# Patient Record
Sex: Male | Born: 1970 | Race: White | Hispanic: No | Marital: Single | Smoking: Never smoker
Health system: Southern US, Community
[De-identification: ages and names within clinical notes are randomized; demographics above are authoritative.]

## PROBLEM LIST (undated history)

## (undated) DIAGNOSIS — N19 Unspecified kidney failure: Secondary | ICD-10-CM

## (undated) DIAGNOSIS — Z8739 Personal history of other diseases of the musculoskeletal system and connective tissue: Secondary | ICD-10-CM

## (undated) DIAGNOSIS — Q676 Pectus excavatum: Secondary | ICD-10-CM

## (undated) DIAGNOSIS — Z87448 Personal history of other diseases of urinary system: Secondary | ICD-10-CM

## (undated) DIAGNOSIS — I341 Nonrheumatic mitral (valve) prolapse: Secondary | ICD-10-CM

## (undated) DIAGNOSIS — I10 Essential (primary) hypertension: Secondary | ICD-10-CM

## (undated) HISTORY — DX: Essential (primary) hypertension: I10

## (undated) HISTORY — DX: Nonrheumatic mitral (valve) prolapse: I34.1

## (undated) HISTORY — DX: Personal history of other diseases of urinary system: Z87.448

## (undated) HISTORY — DX: Personal history of other diseases of the musculoskeletal system and connective tissue: Z87.39

## (undated) HISTORY — PX: KIDNEY TRANSPLANT: SHX239

## (undated) HISTORY — PX: PECTUS EXCAVATUM REPAIR: SHX437

## (undated) HISTORY — DX: Unspecified kidney failure: N19

## (undated) HISTORY — DX: Pectus excavatum: Q67.6

---

## 2002-10-20 ENCOUNTER — Inpatient Hospital Stay (HOSPITAL_COMMUNITY): Admission: EM | Admit: 2002-10-20 | Discharge: 2002-10-27 | Payer: Self-pay | Admitting: Emergency Medicine

## 2002-10-20 ENCOUNTER — Encounter: Payer: Self-pay | Admitting: Emergency Medicine

## 2002-10-20 ENCOUNTER — Encounter (INDEPENDENT_AMBULATORY_CARE_PROVIDER_SITE_OTHER): Payer: Self-pay | Admitting: *Deleted

## 2002-10-23 ENCOUNTER — Encounter: Payer: Self-pay | Admitting: Nephrology

## 2002-10-30 ENCOUNTER — Encounter (HOSPITAL_COMMUNITY): Admission: RE | Admit: 2002-10-30 | Discharge: 2003-01-28 | Payer: Self-pay | Admitting: Nephrology

## 2002-12-25 HISTORY — PX: CARDIOVASCULAR STRESS TEST: SHX262

## 2003-01-03 ENCOUNTER — Ambulatory Visit (HOSPITAL_COMMUNITY): Admission: RE | Admit: 2003-01-03 | Discharge: 2003-01-04 | Payer: Self-pay | Admitting: Cardiology

## 2003-01-08 ENCOUNTER — Encounter: Admission: RE | Admit: 2003-01-08 | Discharge: 2003-01-08 | Payer: Self-pay | Admitting: Nephrology

## 2003-01-08 ENCOUNTER — Encounter: Payer: Self-pay | Admitting: Nephrology

## 2003-01-22 ENCOUNTER — Ambulatory Visit (HOSPITAL_COMMUNITY): Admission: RE | Admit: 2003-01-22 | Discharge: 2003-01-23 | Payer: Self-pay | Admitting: General Surgery

## 2003-04-03 ENCOUNTER — Ambulatory Visit (HOSPITAL_COMMUNITY): Admission: RE | Admit: 2003-04-03 | Discharge: 2003-04-03 | Payer: Self-pay | Admitting: Nephrology

## 2003-04-04 ENCOUNTER — Encounter: Admission: RE | Admit: 2003-04-04 | Discharge: 2003-04-04 | Payer: Self-pay | Admitting: Nephrology

## 2003-04-04 ENCOUNTER — Encounter: Payer: Self-pay | Admitting: Nephrology

## 2003-05-09 HISTORY — PX: US ECHOCARDIOGRAPHY: HXRAD669

## 2003-05-19 ENCOUNTER — Emergency Department (HOSPITAL_COMMUNITY): Admission: EM | Admit: 2003-05-19 | Discharge: 2003-05-19 | Payer: Self-pay | Admitting: Emergency Medicine

## 2003-06-19 ENCOUNTER — Emergency Department (HOSPITAL_COMMUNITY): Admission: EM | Admit: 2003-06-19 | Discharge: 2003-06-19 | Payer: Self-pay | Admitting: Emergency Medicine

## 2003-07-31 ENCOUNTER — Emergency Department (HOSPITAL_COMMUNITY): Admission: EM | Admit: 2003-07-31 | Discharge: 2003-07-31 | Payer: Self-pay | Admitting: Emergency Medicine

## 2003-08-17 ENCOUNTER — Emergency Department (HOSPITAL_COMMUNITY): Admission: AD | Admit: 2003-08-17 | Discharge: 2003-08-18 | Payer: Self-pay | Admitting: *Deleted

## 2003-09-05 ENCOUNTER — Ambulatory Visit (HOSPITAL_COMMUNITY): Admission: RE | Admit: 2003-09-05 | Discharge: 2003-09-05 | Payer: Self-pay | Admitting: Vascular Surgery

## 2004-03-16 ENCOUNTER — Emergency Department (HOSPITAL_COMMUNITY): Admission: EM | Admit: 2004-03-16 | Discharge: 2004-03-16 | Payer: Self-pay | Admitting: Emergency Medicine

## 2004-07-27 HISTORY — PX: US ECHOCARDIOGRAPHY: HXRAD669

## 2004-08-11 ENCOUNTER — Encounter: Admission: RE | Admit: 2004-08-11 | Discharge: 2004-08-11 | Payer: Self-pay | Admitting: Nephrology

## 2004-11-30 ENCOUNTER — Encounter: Admission: RE | Admit: 2004-11-30 | Discharge: 2005-02-28 | Payer: Self-pay | Admitting: Nephrology

## 2005-07-28 HISTORY — PX: US ECHOCARDIOGRAPHY: HXRAD669

## 2005-08-30 ENCOUNTER — Ambulatory Visit (HOSPITAL_COMMUNITY): Admission: RE | Admit: 2005-08-30 | Discharge: 2005-08-30 | Payer: Self-pay | Admitting: Nephrology

## 2007-01-26 HISTORY — PX: US ECHOCARDIOGRAPHY: HXRAD669

## 2007-07-20 ENCOUNTER — Emergency Department (HOSPITAL_COMMUNITY): Admission: EM | Admit: 2007-07-20 | Discharge: 2007-07-20 | Payer: Self-pay | Admitting: Emergency Medicine

## 2008-09-02 ENCOUNTER — Encounter: Admission: RE | Admit: 2008-09-02 | Discharge: 2008-09-02 | Payer: Self-pay | Admitting: Nephrology

## 2008-10-02 ENCOUNTER — Encounter: Admission: RE | Admit: 2008-10-02 | Discharge: 2008-10-02 | Payer: Self-pay | Admitting: Nephrology

## 2009-01-06 HISTORY — PX: US ECHOCARDIOGRAPHY: HXRAD669

## 2010-01-08 ENCOUNTER — Encounter: Admission: RE | Admit: 2010-01-08 | Discharge: 2010-01-08 | Payer: Self-pay | Admitting: Cardiology

## 2010-01-08 HISTORY — PX: US ECHOCARDIOGRAPHY: HXRAD669

## 2010-02-02 ENCOUNTER — Encounter: Admission: RE | Admit: 2010-02-02 | Discharge: 2010-02-02 | Payer: Self-pay | Admitting: Nephrology

## 2010-07-09 ENCOUNTER — Ambulatory Visit: Payer: Self-pay | Admitting: Cardiology

## 2011-01-13 ENCOUNTER — Ambulatory Visit (INDEPENDENT_AMBULATORY_CARE_PROVIDER_SITE_OTHER): Payer: BC Managed Care – PPO | Admitting: Cardiology

## 2011-01-13 DIAGNOSIS — I059 Rheumatic mitral valve disease, unspecified: Secondary | ICD-10-CM

## 2011-01-13 DIAGNOSIS — E7800 Pure hypercholesterolemia, unspecified: Secondary | ICD-10-CM

## 2011-03-11 ENCOUNTER — Other Ambulatory Visit: Payer: Self-pay | Admitting: Cardiology

## 2011-03-12 ENCOUNTER — Other Ambulatory Visit: Payer: Self-pay | Admitting: Cardiology

## 2011-03-12 MED ORDER — CARVEDILOL 25 MG PO TABS: 25.0000 mg | ORAL_TABLET | Freq: Two times a day (BID) | ORAL | Status: DC

## 2011-03-12 NOTE — Telephone Encounter (Signed)
Medication refill

## 2011-03-26 NOTE — Op Note (Signed)
NAME:  Brandon Rangel, Brandon Rangel                      ACCOUNT NO.:  [id number]   MEDICAL RECORD NO.:  [id number]                   PATIENT TYPE:  OIB   LOCATION:  2871                                 FACILITY:  [facility]   PHYSICIAN:  Anselm Pancoast. Zachery Dakins, M.D.          DATE OF BIRTH:  01-25-1971   DATE OF PROCEDURE:  09/05/2003  DATE OF DISCHARGE:                                 OPERATIVE REPORT   PREOPERATIVE DIAGNOSIS:  Recurrent peritonitis on CAPD.   OPERATION/PROCEDURE:  Removal of CAPD catheter.   ANESTHESIA:  General.   SURGEON:  Anselm Pancoast. Zachery Dakins, M.D.   INDICATIONS:  Brandon Rangel is a 40 year old Caucasian male patient who  had a CAPD catheter inserted approximately six months ago but he has had  problems with recurrent peritonitis.  His technique, and etc., has been  definitely checked numerous times.  There is no problem identified.  He has  never actually had an obvious exit site infection.  The patient is on  steroids because of his problems with Goodpasture's  syndrome and this has  probably contributed to his peritonitis.  He has most recently had a problem  that has been treated with Cipro.  He is presently still on Cipro for this  reoccurring problem.  He is for removal of the CAPD catheter and replacement  of Ashe catheter to be switched to hemodialysis.   DESCRIPTION OF PROCEDURE:  The patient was taken to the operative suite.  He  was given Cipro intravenously and induction of general anesthesia via the  endotracheal tube.  The catheter wound was prepped with Betadine surgical  solution and draped in a sterile manner.  There was no evidence of any  active infection at the exit site and I opened the original incision,  dissected down into the subcutaneous tissue and then identified the catheter  and transected it so I could use the internal portion of the handle and then  using a predominantly cautery and sharp dissection, the internal cuff was  completely  separated from the surrounding rectus muscle and posterior rectus  fascia.  I then carefully picked up the internal cuff portion, opened it  carefully into the Silastic ball and then cultured the peritoneal fluid.  At  no time was there any obvious signs of infection of this internal cuff.  I  cultured it aerobic and anaerobically, used a pull sucker to suck out the  remaining little peritoneal dialysis solution and then closed the posterior  rectus fascia and the peritoneum with running 2-0 Vicryl.  The rectus muscle  was approximated with 2-0 Vicryl.  The interior rectus fascia was closed  with interrupted sutures of 2-0 Prolene.  The subcutaneous tissue was closed  with 3-0 chromic and the skin closed with 4-0 nylon.  Next, the external  portion of the cuff was removed.  This also did not reveal any infection and  I removed the ellipse of the skin around the  catheter and removed the  external cuff with a little surrounding adipose tissue and closed the skin  with the same 4-0 Vicryl suture and now we will go ahead and place the Ashe  catheter and the patient will be placed in the short stay recovery room.    DISPOSITION:  He will continue on the Cipro for a couple of days and have  him see [place] back in the office for a follow-up appointment in approximately a  week.                                                Anselm Pancoast. Zachery Dakins, M.D.    WJW/MEDQ  D:  09/05/2003  T:  09/05/2003  Job:  [id number]   cc:   Ronaldo Miyamoto, M.D.

## 2011-03-26 NOTE — Op Note (Signed)
NAME:  Brandon Rangel, Brandon Rangel                      ACCOUNT NO.:  [id number]   MEDICAL RECORD NO.:  [id number]                   PATIENT TYPE:  INP   LOCATION:  5527                                 FACILITY:  [facility]   PHYSICIAN:  Terrial Rhodes, M.D.             DATE OF BIRTH:  26-Mar-1971   DATE OF PROCEDURE:  10/23/2002  DATE OF DISCHARGE:                                 OPERATIVE REPORT   PROCEDURE:  Diatek permanent catheter insertion.   SURGEON:  Terrial Rhodes, M.D.   INDICATIONS FOR PROCEDURE:  Access for hemodialysis.   DESCRIPTION OF PROCEDURE:  The procedure was explained to the patient.  It  was understood and accepted.  The patient signed an informed consent form.  Initially the patient was taken to the fluoroscopy suite and placed on the  fl uroscopy table in the supine position with a towel roll between her  shoulders.  The right internal jugular vein was located in the middle one-  third of the sternocleidomastoid triangle to assure its patency and position  with the Site-Rite ultrasound device.  Subsequently the right side of the  neck and upper chest were prepped and draped in a sterile fashion with  Betadine.  Xylocaine 2% local anesthesia was used to numb up the area of the  sternocleidomastoid triangle in the inferolateral fashion approximately 8.0  cm x 4.0 cm.  Subsequently using the Site-Rite ultrasound device, a #18  gauge thin-walled needle was inserted into the right internal jugular vein.  The straight-tipped guide wire was advanced through the #18 gauge thin-  walled needle into the internal jugular vein, the superior vena cava and the  inferior vena cava under fluoroscopic guidance.  The #18 gauge thin-walled  needle was withdrawn, and the opening where the guide wire penetrated the  skin was made with sharp dissection approximately 1.0 cm in an inferolateral  direction.  Serial dilators were placed over the guide wire, starting with a  10-French, then  going to a 12-French and a 14-French to loss of resistance  in the skin, the subcutaneous tissue and vessel wall, all under fluoroscopic  guidance.  Subsequently a 15-French trocar with a tear-away sheath was  placed over the guide wire and advanced over the internal jugular vein and  the superior vena cava under fluoroscopic guidance.  A non-serrated dialysis  clamp was placed around the trocar and sleeve just outside the skin.  Both  lumens of the 24.0 cm Diatek permanent catheter were flushed with saline.  A  clamp was placed on the distal end.  The trocar was then withdrawn from the  sheath, the sheath clamped with a dialysis clamp, and both lumens started  through the distal end of the sheath.  The sheath was then clamped and the  catheter was advanced through the remainder of the sheath, as the guide wire  was pulled out.  The tear-away sheath was removed and the position of  the  catheter tip was placed at the superior vena cava and right atrial junction.  Subsequently a stab wound position marked 2.0 cm inferior to the clavicle  and approximately 7.0 cm inferolateral to the vein entry, based on the  catheter tip.  A blunt tunneling device was used to facilitate tunnel  formation.  A curvilinear tunnel was performed initially in a superior  direction, then directed medially.  The tip of the tunneling device put  through the vein entering and opening.  The catheter connecting device was  placed in the distal end of the tunneling device, the catheter placed in the  end of the tunneling device and pulled through the tunnel with a Dacron cuff  approximately 3.0 cm from the tunnel exit site.  The catheter was clamped,  cut off, and friction sewn on connection, connecting the blood supply.  There were no kinks in the catheter on fluoroscopy.  The tips were in the  sinoatrial junction.  The arterial pole was medial.  The catheter  was flushed with heparin and then concentrated heparin and a  heparin lock.  The skin over the vein entrance was closed with a #3-0 silk and the two  limbs of the catheter secured with #3-0 silk.  Hypafix dressing was applied.  The patient tolerated the procedure well without any complications.  The  patient was in stable condition.                                               Terrial Rhodes, M.D.    JC/MEDQ  D:  10/23/2002  T:  10/23/2002  Job:  [id number]

## 2011-03-26 NOTE — Op Note (Signed)
NAME:  Brandon Rangel, Brandon Rangel                      ACCOUNT NO.:  [id number]   MEDICAL RECORD NO.:  [id number]                   PATIENT TYPE:  OIB   LOCATION:  2550                                 FACILITY:  [facility]   PHYSICIAN:  Anselm Pancoast. Zachery Dakins, M.D.          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   PROCEDURE:  Placement of chronic ambulatory peritoneal dialysis catheter.   ANESTHESIA:  General anesthesia.   SURGEON:  Anselm Pancoast. Zachery Dakins, M.D.   ASSISTANT:  Magnus Ivan.   HISTORY:  The patient is a 40 year old Caucasian male who has developed  renal failure secondary to Goodpasture's syndrome.  He has been on steroids  and Cytoxan, but no improvement.  He is on hemodialysis on Monday,  Wednesday, and Friday, and desires to switch to CAPD.  He has tapered his  steroids now to 5 mg a day.  He had a cardiac catheterization a few weeks  ago, and he is for placement of a CAPD catheter at this time.  I recommended  that we place him under general anesthesia, and he was in agreement.  The  patient was given 1 g of Kefzol immediately preoperatively.  His potassium  is normal today.   DESCRIPTION OF PROCEDURE:  He was taken to the operative suite, induction of  general anesthesia, endotracheal tube.  Originally we used an LOA tube, but  then this was switched to an endotracheal tube after he basically would not  basically relax and needed to be paralyzed.  I made a small vertical  incision, sharp dissection down through the skin and subcutaneous tissue,  there was a superficial vein that was ligated with 3-0 chromic, and then the  anterior rectus fascia was identified.  A little vertical incision was made,  the underlying rectus muscle was split in the  direction of its fibers,  exposing the posterior rectus fascia.  I anesthetized the muscle and  peritoneum with Marcaine with adrenalin but it was still rigid, and it was  at this point that they switched him to a general with an endotracheal tube  instead of an LOA tube.  The muscles were then relaxed and I could pick up  the posterior rectus fascia and the peritoneum with a hemostat, it was a  good, thick layer, and a small opening made through both layers into the  peritoneal cavity.  I then placed two pursestring sutures of 2-0 Vicryl.  The [place] right catheter and guidewire was inserted in the lower abdomen.  The pursestring sutures were tied so that the Silastic ball was in the  peritoneal cavity and the internal cuff was lying flat on the posterior  rectus fascia.  The rectus muscle was approximated with a 2-0 Vicryl and the  anterior rectus fascia was closed with interrupted sutures of 2-0 Prolene.  The catheter was tunneled to exit in the right lower quadrant.  There was a  little bleeding as I was pulling the catheter through it, but I used the  Marcaine and this appeared to control it.  The subcutaneous tissue was  closed with 3-0 chromic and then the skin was closed with interrupted simple  sutures of 5-0 nylon.  The catheter, I had  repositioned it with the guidewire, and then the connectors and extension  tube were hooked up and capped off.  The patient will have a catheter flush  this afternoon and whether he will be hemodialyzed here in the morning or  whether he will go to his regular dialysis at 11 will be left to the  discretion of the renal doctors.                                               Anselm Pancoast. Zachery Dakins, M.D.    WJW/MEDQ  D:  01/22/2003  T:  01/22/2003  Job:  [id number]   cc:   Maree Krabbe, M.D.  [address]  [place]  [place] [address]  Fax: [phone]

## 2011-03-26 NOTE — H&P (Signed)
NAME:  Brandon Rangel, Brandon Rangel                      ACCOUNT NO.:  [id number]   MEDICAL RECORD NO.:  [id number]                   PATIENT TYPE:  OIB   LOCATION:  NA                                   FACILITY:  [facility]   PHYSICIAN:  Peter M. [place], M.D.               DATE OF BIRTH:  07/02/1971   DATE OF ADMISSION:  01/03/2003  DATE OF DISCHARGE:                                HISTORY & PHYSICAL   HISTORY OF PRESENT ILLNESS:  The patient is a 40 year old white male who has  a long-standing history of mitral valve prolapse.  In December of 2003 he  developed Goodpasture's syndrome with rapidly progressive renal failure and  is currently on hemodialysis.  He is being considered for renal transplant  and was referred for stress Cardiolite study which was performed on December 25, 2002.  The patient was able to exercise for five minutes on Bruce  protocol with adequate heart rate and blood pressure response.  He had no  chest pain or significant ST segment changes.  However, his Cardiolite  images demonstrated a left ventricular enlargement with decreased ejection  fraction of 35%.  There was some patchy abnormality in the anterior wall  suspicious for possible ischemia.  It is noted that the patient had a  previous echocardiogram in July of 2003 which demonstrated normal ejection  fraction, 60-65%.  He also had mitral valve prolapse with mild mitral  insufficiency.  The patient also notes that since this diagnosis of  Goodpasture's disease he has had severe hypertension, which is new.  He had  always been normotensive in the past.  Because of his abnormal Cardiolite  images in finding of depressed left ventricular function, he is now admitted  for right and left heart catheterization coronary angiography.   PAST MEDICAL HISTORY:  1. Mitral valve prolapse.  2. History of pectus excavatum with corrective surgery in the past by Dr.     Edilia Bo.  3. History of Goodpasture's syndrome with rapidly  progressive renal failure.  4. History of hypertension.   PAST SURGICAL HISTORY:  Excavatum repair.   ALLERGIES:  No known allergies.   CURRENT MEDICATIONS:  1. The patient is on a tapering dose of Cytoxan.  2. He is on prednisone 20 mg per day.  3. Aspirin 81 mg per day.  4. Nephro-Vite daily.  5. He is on some type of blood pressure medicine, but cannot remember the     name.   SOCIAL HISTORY:  The patient is single.  He denies tobacco or alcohol use.   FAMILY HISTORY:  Father is age 54 and in good health.  Mother has ovarian  CA.   REVIEW OF SYSTEMS:  The patient denies any swelling, orthopnea, or PND.  He  has had no palpitations or chest pain.  No history of TIA or stroke.  He  denies any joint aches.  Other review of systems are  negative.   PHYSICAL EXAMINATION:  GENERAL:  The patient is a slender, young white male  in no apparent distress.  VITAL SIGNS:  Blood pressure is 160/100, pulse is 92, weight is 151,  respirations are normal at 20.  He is afebrile.  HEENT:  Pupils equal, round, and reactive to light and accommodation.  Extraocular movements are intact.  Oropharynx is clear.  NECK:  Without JVD, adenopathy, thyromegaly, or bruits.  LUNGS:  Clear.  CARDIAC:  Reveals regular rate and rhythm with a very loud midsystolic  click, followed by a grade 2/6 late systolic murmur at the apex.  There is  no S3.  ABDOMEN:  Soft, nontender, without hepatosplenomegaly, masses, or bruits.  Femoral and pedal pulses are 2+ and symmetric.  EXTREMITIES:  The patient has a dialysis catheter in the right subclavian  region.  Pulses are 2+ and symmetric.  NEUROLOGIC:  Nonfocal.   LABORATORY DATA:  Resting ECG shows normal sinus rhythm with left  ventricular enlargement.  Chest x-ray shows cardiomegaly with no active  disease.   IMPRESSION:  1. Cardiomyopathy, etiology unclear with abnormal stress Cardiolite study.  2. Goodpasture's syndrome with rapidly progressive renal  failure, now in     hemodialysis.  3. Hypertension.  4. Mitral valve prolapse.   PLAN:  The patient will be admitted for right and left heart catheterization  and coronary angiography.                                                   Peter M. [place], M.D.    PMJ/MEDQ  D:  12/27/2002  T:  12/27/2002  Job:  [id number]   cc:   Wilber Bihari. Caryn Section, M.D.  [address]  [place]  [place] [address]  Fax: [phone]   Cassell Clement, M.D.  [address]  [place]  [place] [address]  Fax: [phone]

## 2011-03-26 NOTE — Op Note (Signed)
   NAME:  Brandon Rangel, Brandon Rangel                      ACCOUNT NO.:  [id number]   MEDICAL RECORD NO.:  [id number]                   PATIENT TYPE:  OIB   LOCATION:  2871                                 FACILITY:  [facility]   PHYSICIAN:  Di Kindle. Edilia Bo, M.D.        DATE OF BIRTH:  Mar 27, 1971   DATE OF PROCEDURE:  09/05/2003  DATE OF DISCHARGE:                                 OPERATIVE REPORT   PREOPERATIVE DIAGNOSIS:  Infected peritoneal dialysis catheter.   POSTOPERATIVE DIAGNOSIS:  Infected peritoneal dialysis catheter.   PROCEDURE:  1. Ultrasound of bilateral internal jugular veins.  2. Placement of a right internal jugular Diatech catheter (28 cm).   INDICATIONS FOR PROCEDURE:  This is a 40 year old gentleman who has had  repeated infections of his peritoneal dialysis catheter.  Today in the  operating room, Dr. Zachery Dakins removed this catheter and I was asked to place  a hemodialysis catheter.  I had seen the patient in the office yesterday and  we discussed the procedure and potential complications.   SURGICAL TECHNIQUE:  The patient had received a general anesthetic.  I  identified both internal jugular veins with the ultrasound scanner and these  were marked.  The neck and upper chest were then prepped and draped in the  usual sterile fashion.  The patient was then placed in Trendelenburg and the  right internal jugular vein was cannulated and a guide-wire introduced into  the superior vena cava under fluoroscopic control.  The tract over the wire  was then dilated and then a dilator and peel away sheath were passed over  the wire and the wire and dilator removed.  The catheter was then passed  through the peel away sheath and positioned in the right atrium.  The exit  site of the catheter was selected and brought through the tunnel and cut to  the appropriate length and the distal ports were attached.  Both ports  withdrew easily and were then flushed with heparinized saline  and filled  with concentrated heparin.  The catheter was secured at its exit site with 3-  0 nylon suture.  The IJ cannulation site was closed with a 4-0 subcuticular  stitch.  A sterile dressing was applied.  The patient tolerated the  procedure well and was transferred to the recovery room in satisfactory  condition.  All needle and sponge counts were correct.                                               Di Kindle. Edilia Bo, M.D.    CSD/MEDQ  D:  09/05/2003  T:  09/05/2003  Job:  [id number]

## 2011-03-26 NOTE — Consult Note (Signed)
NAME:  Brandon Rangel                      ACCOUNT NO.:  [id number]   MEDICAL RECORD NO.:  [id number]                   PATIENT TYPE:  INP   LOCATION:  5527                                 FACILITY:  [facility]   PHYSICIAN:  Maree Krabbe, M.D.             DATE OF BIRTH:  January 31, 1971   DATE OF CONSULTATION:  DATE OF DISCHARGE:                                   CONSULTATION   NEPHROLOGY CONSULTATION:   REASON FOR CONSULTATION:  Elevated creatinine.   HISTORY OF PRESENT ILLNESS:  The patient is a 40 year old previously healthy  white male with a history of mitral valve prolapse who was doing well until  the last week of October about 6 weeks ago when he developed the subacute  onset of generalized weakness, lethargy, anorexia, and just not feeling  right.  He got a little bit better but then over the last couple of weeks  he has gotten progressively worse with increasing nausea, fatigue, malaise  and vomiting over the past 3 days.  He has had a persistent dry cough but no  hemoptysis, chest pain, fever, chills, or sweats or purulent sputum  production.  At the onset of his illness he denied any URI symptoms, coryza,  severe sore throat, high fever, chills, sweats.  He does note dark urine but  he is not sure how long.  He has not been taking any over-the-counter  NSAIDs.  He was started on Biaxin not too long ago without improvement.  He  has no HIV risk factors.  He has been noting epistaxis occasionally for the  last couple of weeks.   PAST MEDICAL HISTORY:  Mitral valve prolapse and surgery at age 28 for pectus  excavatum.   FAMILY HISTORY:  Father alive and well at age 69; mother 49 with ovarian  cancer; brother in his 30s is alive and well.   MEDICATIONS:  Biaxin, Allegra, and Nasonex.  No long-term meds.   ALLERGIES:  No known allergies.   SOCIAL HISTORY:  Quarry manager, single, lives alone, heterosexual.  No  tobacco, occasional alcohol, no drugs.   REVIEW OF  SYSTEMS:  GENERAL: Denies fever, chills, night sweats, weight  loss.  ENT: Denies hearing loss, visual changes, sore throat, difficulty  swallowing.  RESPIRATORY: Denies purulent sputum production, hemoptysis,  chest pain, pleuritic chest pain.  CARDIAC: Denies orthopnea, PND,  substernal chest pain.  History of mitral valve prolapse as above.  GI: No  abdominal pain, no diarrhea.  GU: No dysuria or difficulty voiding.  Dark  urine as above.  No history of kidney stones or kidney failure in the past.  MUSCULOSKELETAL: No specific joint pain or swelling, no history of  arthritis, no significant arthralgias or myalgias.  NEUROLOGIC: No focal  numbness or weakness.  ENDOCRINE: No history of diabetes or bleeding  disorder.   PHYSICAL EXAMINATION:  VITAL SIGNS:  Temperature 98.4, respirations 20,  heart rate 120, ___________  now 96, blood pressure 120/84, 98% saturation on  room air.  GENERAL:  The patient is a pale weak sluggish young white male in no  distress; skin with no rash; no edema.  HEENT:  Fundi show hemorrhage; no exudates; disk margins are clear.  Throat  is clear.  NECK:  Supple with no lymphadenopathy and flat neck veins.  CHEST:  Clear throughout.  CARDIAC:  Regular rate and rhythm without murmur, rub, or gallop.  ABDOMEN:  Soft; no organomegaly.  EXTREMITIES:  No edema.  SKIN:  No rash; no purpura; no petechiae.   LABORATORIES:  Sodium 128, potassium 3.9, CO2 16, BUN 50, creatinine 12.2,  anion gap is 14, glucose 132, calcium 8.4.  White blood count 12.8,  hemoglobin 10.9, platelets 514.  Chest x-ray: Carotid vessels with pectus  deformity; no infiltrates; no overt CHF.  Urinalysis done by myself shows  abundant dysmorphic red blood cells and red blood cell cast fragments, 100  protein, large blood, minimal white blood cells, no bacteria, negative  leukocyte esterase, negative nitrites.   IMPRESSION:  1. 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  No evidence of systemic disease such as lupus or     liver disease.  No significant pulmonary involvement.  2. Volume depletion.  3. Metabolic acidosis due to #1.   PLAN:  Recommend admission to [facility].  He needs dialysis, serologies,  bolus corticosteroids 1 g q.d. for 3 days and a renal biopsy.  IV fluids at  150 cc/hr.  We will do daily dialysis for 3 days and I discussed the risks  and benefits of dialysis, dialysis catheter placement, renal biopsy, and  high-dose corticosteroids with the patient who agrees to proceed.  I will  discuss this with his family also.                                               Maree Krabbe, M.D.    RDS/MEDQ  D:  10/20/2002  T:  10/21/2002  Job:  [id number]

## 2011-03-26 NOTE — Discharge Summary (Signed)
NAME:  Brandon Rangel, Brandon Rangel                      ACCOUNT NO.:  [id number]   MEDICAL RECORD NO.:  [id number]                   PATIENT TYPE:  INP   LOCATION:  5527                                 FACILITY:  [facility]   PHYSICIAN:  Sol Blazing, M.D.             DATE OF BIRTH:  1971/04/10   DATE OF ADMISSION:  10/20/2002  DATE OF DISCHARGE:  10/27/2002                                 DISCHARGE SUMMARY   DISCHARGE DIAGNOSES:  1. 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  2. Anemia.  3. Hypertension.  4. Leukocytosis.   PROCEDURES:  1. Renal biopsy, Dr. Hassell Done, October 20, 2002, showed crescentic glomerular     nephritis with 80% glomerular involvement with segmental necrosis.  2. Right femoral dialysis catheter placement, October 20, 2002, Dr.     Jonnie Finner.  3. Hemodialysis.  4. Plasmapheresis.  5. Placement of a right IJ Diatek catheter, Dr. Marval Regal, October 23, 2002.  6. Transfusion two units of packed red cells, October 26, 2002.   CONSULTATIONS:  Dr. Roney Jaffe.   HISTORY OF PRESENT ILLNESS:  The patient is a 40 year old white male who  presented to [facility] complaining of feeling weak and miserable  for approximately six days prior to admission.  The patient has had an upper  respiratory illness and flu-like symptoms for about eight days, for which he  went to the [facility] clinic and was treated with Allegra, Nasonex,  Biaxin with some improvement in his symptoms.  However, over the past few  days he has felt progressively weak with nausea which was severe and  therefore, he came to the emergency department for evaluation.  His cough  and shortness of breath have improved somewhat; however, he has severe  nausea and generalized weakness which is his major concern right now.  He  had a new episode of epistaxis and vomiting last week and fevers, no chills,  no sore throat, no  abdominal pain, no dysuria or frequency, no chest pain or  palpitations.   PHYSICAL EXAMINATION:  VITAL SIGNS:  Physical exam in the emergency room  showed temperature 98.4, O2 saturations within normal limits.  Blood  pressure 162/84 with pulse rate of 120, going down to 96.  Respirations 22.  GENERAL:  Looks acutely ill and weak.  HEENT:  Palate pale with no icterus.  PERRL, EOMI.  Oropharynx dry.  No  exudates.  TMs okay.  NECK:  Supple.  No thyromegaly, no lymphadenopathy.  LUNGS:  Few rhonchi, adequate vesicular breath sounds.  Overt pectus  excavatum.  Substernal surgical scar.  HEART:  Regular rate and rhythm without gallop.  Systolic murmur left lower  sternal border.  No rub.  ABDOMEN:  Bowel sounds, nontender.  No hepatosplenomegaly.  EXTREMITIES:  No edema, no cyanosis.  NEUROLOGIC:  Alert and oriented x3, no focal deficits.   LABORATORY DATA:  Sodium 128, potassium 3.9, chloride 98, CO2 16, BUN 62,  creatinine 12.2, glucose 132.  White count 12.8, hemoglobin 10.8, hematocrit  30.9, platelets 514,000.   Chest x-ray shows increased markings.   The patient will be transferred and admitted to [facility]  He  will receive a renal consult.   HOSPITAL COURSE:  1. 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 Jonnie Finner who continued rehydration with sodium bicarb to     correct the patient's metabolic acidosis.  Arrangements were made for him     to have a renal biopsy which was done by Dr. Hassell Done on December 13 with     results as described above.  The patient recovered nicely from that.  He     also had  a femoral dialysis catheter placed by Dr. Jonnie Finner on December     13 and began serial hemodialysis.  A multitude of studies were done.  His     HIV was negative.  ANA was negative.  Hepatitis B surface antigen was     negative.  Hepatitis C was negative.  C4 and C3 were not decreased.  ESR     was 22.  Serum osmolality was 285.   Uric acid 5.6.  Iron studies within     normal limits.  C-ANCA and P-ANCA were both positive.  Intact PTH was     143.  ASO titer was 47 which is within normal limits.  Cryoglobulins were     negative at 72 hours and the patient's anti-GBM level which was drawn on     December 14 was 133.  After several treatments of dialysis, it was     realized that he would need at least a short term tunnel dialysis     catheter.  This was placed by Dr. Markus Jarvis without any difficulty     on December 16.  Once biopsy results came back, the patient continued on     prednisone which Dr. Jonnie Finner had started upon admission, but also was     started on Cytoxan and plasmapheresis.  Arrangements were made for the     patient to start outpatient dialysis at the [facility].     He had a repeat anti-GBM drawn on December 20 which was 95.  His renal     status did not improve as of discharge; however, he was tolerating all     modalities without any difficulties.  At least for awhile he will     continue outpatient three times a week plasmapheresis as well as three     times a week hemodialysis.  Anti-GBM levels will be rechecked as well as     renal functions monitored to decide when to discontinue plasmapheresis     and other modalities.  Renal function at the time of discharge:  Sodium     136, potassium 4.6, chloride 103, CO2 23, BUN 78, creatinine 8.7, calcium     8.1.  Dialysis orders were called to the outpatient center.   1. ANEMIA:  The patient's hemoglobin decreased steadily during his     hospitalization from 10 to 7.3 at one point and then to 8.8.  His iron     studies were within normal limits.  Epogen was started; however, because     he was feeling so poorly, he was transfused two units of packed red cells  on December 19.  Hemoglobin will be followed up in the outpatient center     per protocol.   1. HYPERTENSION:  The patient was not on any antihypertensive medicines     during his hospitalization.  This was controlled with gradual volume     removal.  He had minimal urine output during most of his hospitalization.     Blood pressure after his last dialysis treatment before discharge was     139/73 after a net UF of 3000 and a postdialysis weight of 76.5.   1. LEUKOCYTOSIS:  The patient's white cells increased during his     hospitalization up to 18,000.  At the time of discharge he was afebrile.     It is presumed that this is secondary to prednisone.   CONDITION ON DISCHARGE:  Improved.   DISPOSITION:  The patient was discharged to home.  The patient generally  lives by himself but he will live with his family at least for the short  term while he is recuperating.   DISCHARGE MEDICATIONS:  1. Cytoxan 150 mg per day.  2. Prednisone 80 mg per day.  3. Protonix 40 mg per day.  4. Calcium carbonate 500 mg two tablets with meals three times a day.  5. The patient is to take three calcium carbonate or three Tums EX before     each plasmapheresis treatment.  6. Nephro-Vite one tablet per day.  7. Tylenol 325 mg 1-2 tablets every six hours as needed for pain.   WOUND CARE:  The patient was instructed to keep his catheter dry.  The staff  at the dialysis center will change dressing.  He is not to take showers at  this time.  Dialysis schedule will be Monday, Wednesday, Friday at [facility] at 3:45.  Specific dialysis orders were called to the outpatient  dialysis center.  Epogen dose at the time of discharge was 8000 units IV  each hemodialysis.         Alric Seton, P.A.                      Sol Blazing, M.D.    MB/MEDQ  D:  12/04/2002  T:  12/04/2002  Job:  [id number]   cc:   Sol Blazing, M.D.  [address]  [place]  [place] [address]  Fax: [phone]   Benito Mccreedy, M.D.  [place]  [place] [address]  Fax: [place]

## 2011-03-26 NOTE — Cardiovascular Report (Signed)
NAME:  Brandon Rangel, Brandon Rangel                      ACCOUNT NO.:  [id number]   MEDICAL RECORD NO.:  [id number]                   PATIENT TYPE:  OIB   LOCATION:  2005                                 FACILITY:  [facility]   PHYSICIAN:  Peter M. Swaziland, M.D.               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  As  part of a work-up for a possible renal transplant he underwent a stress  Cardiolite study which demonstrated significant left ventricular dysfunction  with an ejection fraction of 35%.  The patient does have a history of  hypertension.   ACCESS:  Via the right femoral artery and vein using the standard Seldinger  technique.   EQUIPMENT:  The 6 French 4 cm right and left Judkins catheter, 6 French  pigtail catheter, 6 French arterial sheath, 8 French venous sheath, 7 French  balloon-tipped Swan-Ganz catheter.   MEDICATIONS:  Lopressor 5 mg IV, nitroglycerin sublingual x1.   CONTRAST:  Omnipaque, 160 mL.   COMMENTARY:  The patient tolerated the procedure well without complications.   HEMODYNAMIC DATA:  Right atrial pressure was 7/7 with a mean of 5 mmHg.  Right ventricular pressure was 24 with an EDP of 7 mmHg.  Pulmonary artery  pressures 22/14 with a mean of 18 mmHg and pulmonary capillary wedge  pressure was 14/12 with a mean of 11 mmHg. V waves were normal.  The aortic  pressure was 165/115 with a mean of 137.  Left ventricular pressure was 158  with an EDP of 13 mmHg.  There was no significant shunt.  There were no  significant aortic or mitral valve gradients. Cardiac output was 5.9 by Fick  determination with an index of 3.06 by thermodilution.  Cardiac output was  6.1 with an index of 3.15.   ANGIOGRAPHIC DATA:  A left ventricular  angiography was performed in the RAO  and LAO cranial views. This demonstrates at least moderate left ventricular  dilatation with global left ventricular hypokinesia, perhaps more pronounced  in the inferior wall.  Overall, left ventricular systolic function is  moderate to severely reduced with ejection fraction calculated at 33%.  There is prominent marked mitral valve prolapse but only mild mitral  insufficiency.  The aortic valve appears normal.   CORONARY ANGIOGRAPHY:  1. The left coronary artery arises and distributes normally.  2. The left main is short with no significant disease.  3. The left anterior descending artery is a very large caliber vessel which     appears normal throughout.  4. The left circumflex coronary artery is a very large caliber vessel which     is normal.  5. The right coronary artery arises inferiorly and is a large  vessel without     disease.   FINAL INTERPRETATION:  1. Normal coronary anatomy.  2. Enlarged left ventricle with global hypokinesis and moderate to severe     left ventricular dysfunction.  3. Mitral valve prolapse with mild mitral insufficiency.  4. Normal right heart and left ventricular filling pressures.                                                   Peter M. Swaziland, M.D.    PMJ/MEDQ  D:  01/03/2003  T:  01/05/2003  Job:  [id number]   cc:   Cassell Clement, M.D.  [address], [address]  [place]  [place] [address]  Fax: [phone]   Wilber Bihari. Caryn Section, M.D.  [address]  [place]  [place] [address]  Fax: [phone]   Donia Guiles, M.D.  [address] [place]  [place] [address]  Fax: [phone]

## 2011-08-20 LAB — CBC
HCT: 37.4 — ABNORMAL LOW
Hemoglobin: 12.9 — ABNORMAL LOW
MCHC: 34.6
MCV: 83.5
Platelets: 225
RBC: 4.48
RDW: 13
WBC: 6.6

## 2011-09-02 ENCOUNTER — Encounter: Payer: Self-pay | Admitting: Cardiology

## 2011-09-06 ENCOUNTER — Encounter: Payer: Self-pay | Admitting: Cardiology

## 2011-09-06 ENCOUNTER — Ambulatory Visit (INDEPENDENT_AMBULATORY_CARE_PROVIDER_SITE_OTHER): Payer: BC Managed Care – PPO | Admitting: Cardiology

## 2011-09-06 VITALS — BP 118/64 | HR 57 | Ht 74.0 in | Wt 183.0 lb

## 2011-09-06 DIAGNOSIS — I341 Nonrheumatic mitral (valve) prolapse: Secondary | ICD-10-CM | POA: Insufficient documentation

## 2011-09-06 DIAGNOSIS — I059 Rheumatic mitral valve disease, unspecified: Secondary | ICD-10-CM

## 2011-09-06 DIAGNOSIS — E7800 Pure hypercholesterolemia, unspecified: Secondary | ICD-10-CM

## 2011-09-06 NOTE — Patient Instructions (Signed)
Your physician has requested that you have an echocardiogram. Echocardiography is a painless test that uses sound waves to create images of your heart. It provides your doctor with information about the size and shape of your heart and how well your heart's chambers and valves are working. This procedure takes approximately one hour. There are no restrictions for this procedure.  Will call you with the results  Your physician wants you to follow-up in: 6 months You will receive a reminder letter in the mail two months in advance. If you don't receive a letter, please call our office to schedule the follow-up appointment.

## 2011-09-06 NOTE — Progress Notes (Signed)
Brandon Rangel Date of Birth:  1971-05-23 [facility] [address]   [address] [place], [place]  [address] [phone]           Fax   [phone]  HPI: Pleasant 40 year old gentleman is seen for a scheduled six-month followup office visit.  Has a history of mitral valve prolapse.  He has had some mild dyspnea walking up hills, but otherwise no cardiac symptoms.  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  Current Outpatient Prescriptions  Medication Sig Dispense Refill  . aspirin 81 MG tablet Take 81 mg by mouth daily.        . carvedilol (COREG) 25 MG tablet Take 1 tablet (25 mg total) by mouth 2 (two) times daily.  180 tablet  3  . ezetimibe-simvastatin (VYTORIN) 10-20 MG per tablet Take 1 tablet by mouth 3 (three) times a week.        . mycophenolate (CELLCEPT) 500 MG tablet Take 750 mg by mouth 2 (two) times daily.        Marland Kitchen sulfamethoxazole-trimethoprim (BACTRIM DS) 800-160 MG per tablet Take 1 tablet by mouth 3 (three) times a week.        . tacrolimus (PROGRAF) 0.5 MG capsule Take 0.5 mg by mouth 2 (two) times daily.          No Known Allergies  Patient Active Problem List  Diagnoses  . MVP (mitral valve prolapse)    History  Smoking status  . Never Smoker   Smokeless tobacco  . Not on file    History  Alcohol Use No    Family History  Problem Relation Age of Onset  . Ovarian cancer Mother     Review of Systems: The patient denies any heat or cold intolerance.  No weight gain or weight loss.  The patient denies headaches or blurry vision.  There is no cough or sputum production.  The patient denies dizziness.  There is no hematuria or hematochezia.  The patient denies any muscle aches or arthritis.  The patient denies any rash.  The patient denies frequent falling or instability.   There is no history of depression or anxiety.  All other systems were reviewed and are negative.   Physical Exam: Filed Vitals:   09/06/11 1455  BP: 118/64  Pulse: 57   general appearance reveals a well-developed, well-nourished, gentleman in distress.  Pupils equal and reactive.   Extraocular Movements are full.  There is no scleral icterus.  The mouth and pharynx are normal.  The neck is supple.  The carotids reveal no bruits.  The jugular venous pressure is normal.  The thyroid is not enlarged.  There is no lymphadenopathy.  The chest is clear to percussion and auscultation. There are no rales or rhonchi. Expansion of the chest is symmetrical.  The precordium is quiet.  The first heart sound is normal.  The second heart sound is physiologically split.  There is no  gallop rub or click.  There is no abnormal lift or heave.  There is a grade 2/6 mid and late systolic murmur at the apex, consistent with mitral valve prolapse. The abdomen is soft and nontender. Bowel sounds are normal. The liver and spleen are not enlarged. There Are no abdominal masses. There are no bruits.  The pedal pulses are good.  There  is no phlebitis or edema.  There is no cyanosis or clubbing. Strength is normal and symmetrical in all extremities.  There is no lateralizing weakness.  There are no sensory deficits.  The skin is warm and dry.  There is no rash.  EKG shows sinus bradycardia, and possible left atrial enlargement      Assessment / Plan:  Continue on same medication.  He will return soon for an echocardiogram.  Return in 6 months for followup office visit.

## 2011-09-06 NOTE — Assessment & Plan Note (Signed)
The patient has a history of mitral valve prolapse.  His last echocardiogram on 01/08/10 showed an ejection fraction of 55-60% and mild mitral regurgitation and normal pulmonary artery pressure.  His EKG today shows sinus bradycardia with right axis deviation, and possible left atrial enlargement.  He has not been having any cardiac symptoms other than dyspnea climbing hills.  Does exercise regularly at home using some gymnasium equipment and also a treadmill

## 2011-09-06 NOTE — Assessment & Plan Note (Signed)
She has a history of hypercholesterolemia, and is on Zetia.  His lipids are followed by nephrology

## 2011-09-09 ENCOUNTER — Ambulatory Visit (HOSPITAL_COMMUNITY): Payer: BC Managed Care – PPO | Attending: Cardiology | Admitting: Radiology

## 2011-09-09 DIAGNOSIS — I059 Rheumatic mitral valve disease, unspecified: Secondary | ICD-10-CM | POA: Insufficient documentation

## 2011-09-09 DIAGNOSIS — I341 Nonrheumatic mitral (valve) prolapse: Secondary | ICD-10-CM

## 2011-09-09 DIAGNOSIS — I379 Nonrheumatic pulmonary valve disorder, unspecified: Secondary | ICD-10-CM | POA: Insufficient documentation

## 2011-09-09 DIAGNOSIS — I079 Rheumatic tricuspid valve disease, unspecified: Secondary | ICD-10-CM | POA: Insufficient documentation

## 2011-09-14 NOTE — Progress Notes (Signed)
Left message

## 2011-10-12 ENCOUNTER — Telehealth: Payer: Self-pay | Admitting: *Deleted

## 2011-10-12 NOTE — Telephone Encounter (Signed)
Message copied by Burnell Blanks on Tue Oct 12, 2011 12:20 PM ------      Message from: Cassell Clement      Created: Sun Sep 12, 2011  5:49 PM       Please report.  The Echo shows continued good LV function.  The mitral regurgitation is moderate instead of mild this time.  We will want to do another echo in 1 year. CSD

## 2011-10-12 NOTE — Telephone Encounter (Signed)
Advised of echo results 

## 2012-03-10 ENCOUNTER — Other Ambulatory Visit: Payer: Self-pay | Admitting: Cardiology

## 2012-03-10 NOTE — Telephone Encounter (Signed)
Refilled carvedilol.

## 2013-08-17 ENCOUNTER — Other Ambulatory Visit: Payer: Self-pay | Admitting: Cardiology

## 2013-08-17 NOTE — Telephone Encounter (Signed)
Left message to call back to call back for ov

## 2013-10-24 ENCOUNTER — Other Ambulatory Visit: Payer: Self-pay | Admitting: Cardiology

## 2013-10-30 ENCOUNTER — Telehealth: Payer: Self-pay | Admitting: *Deleted

## 2013-10-30 NOTE — Telephone Encounter (Signed)
Patient requesting refill, needs ov per  Dr. Patty Sermons. Left message to call back

## 2013-12-06 NOTE — Telephone Encounter (Signed)
Appointment in system for 12/11/13, will refill rx as requested

## 2013-12-11 ENCOUNTER — Encounter: Payer: Self-pay | Admitting: Cardiology

## 2013-12-11 ENCOUNTER — Ambulatory Visit (INDEPENDENT_AMBULATORY_CARE_PROVIDER_SITE_OTHER): Payer: BC Managed Care – PPO | Admitting: Cardiology

## 2013-12-11 VITALS — BP 121/65 | HR 70 | Ht 74.0 in | Wt 178.8 lb

## 2013-12-11 DIAGNOSIS — I059 Rheumatic mitral valve disease, unspecified: Secondary | ICD-10-CM

## 2013-12-11 DIAGNOSIS — I341 Nonrheumatic mitral (valve) prolapse: Secondary | ICD-10-CM

## 2013-12-11 DIAGNOSIS — E7800 Pure hypercholesterolemia, unspecified: Secondary | ICD-10-CM

## 2013-12-11 NOTE — Assessment & Plan Note (Addendum)
Patient has a history of hypercholesterolemia followed by nephrology.  He is on Vytorin.

## 2013-12-11 NOTE — Assessment & Plan Note (Signed)
The patient does not think that his symptoms from his mitral valve prolapse progressed since we last saw him.  At the time of his last visit his mitral regurgitation had increased by echo from the previous echo.  He is not having any chest pain.

## 2013-12-11 NOTE — Patient Instructions (Signed)
Your physician has requested that you have an echocardiogram. Echocardiography is a painless test that uses sound waves to create images of your heart. It provides your doctor with information about the size and shape of your heart and how well your heart's chambers and valves are working. This procedure takes approximately one hour. There are no restrictions for this procedure.  Your physician wants you to follow-up in: 1 year ov/ekg You will receive a reminder letter in the mail two months in advance. If you don't receive a letter, please call our office to schedule the follow-up appointment.   Your physician recommends that you continue on your current medications as directed. Please refer to the Current Medication list given to you today.

## 2013-12-11 NOTE — Progress Notes (Signed)
Brandon Rangel Date of Birth:  Oct 19, 1971 [address]   [address] [place], [place]  [id number] [phone]           Fax   [phone]  HPI: Pleasant 43 year old gentleman is seen for a scheduled followup office visit.Marland Kitchen.  Has a history of mitral valve prolapse.  His last echocardiogram on 09/09/11 showed ejection fraction 55-60% with moderate mitral regurgitation and mild left atrial enlargement.  He has had some mild dyspnea walking up hills, but otherwise no cardiac symptoms.  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  Current Outpatient Prescriptions  Medication Sig Dispense Refill  . aspirin 81 MG tablet Take 81 mg by mouth daily.        . carvedilol (COREG) 25 MG tablet TAKE 1 TABLET TWICE A DAY (CALL OFFICE FOR APPOINTMENT)  180 tablet  0  . ezetimibe-simvastatin (VYTORIN) 10-20 MG per tablet Take 1 tablet by mouth 3 (three) times a week.        . mycophenolate (CELLCEPT) 500 MG tablet Take 750 mg by mouth 2 (two) times daily.        Marland Kitchen. sulfamethoxazole-trimethoprim (BACTRIM DS) 800-160 MG per tablet Take 1 tablet by mouth 3 (three) times a week.        . tacrolimus (PROGRAF) 0.5 MG capsule Take 0.5 mg by mouth 2 (two) times daily.         No current facility-administered medications for this visit.    No Known Allergies  Patient Active Problem List   Diagnosis Date Noted  . MVP (mitral valve prolapse) 09/06/2011  . Hypercholesterolemia 09/06/2011    History  Smoking status  . Never Smoker   Smokeless tobacco  . Not on file    History  Alcohol Use No    Family History  Problem Relation Age of Onset  . Ovarian cancer Mother     Review of Systems: The patient denies any heat or cold intolerance.  No weight gain or weight loss.  The patient denies headaches or blurry vision.  There is no cough or sputum production.   The patient denies dizziness.  There is no hematuria or hematochezia.  The patient denies any muscle aches or arthritis.  The patient denies any rash.  The patient denies frequent falling or instability.  There is no history of depression or anxiety.  All other systems were reviewed and are negative.   Physical Exam: Filed Vitals:   12/11/13 1451  BP: 121/65  Pulse: 70   general appearance reveals a well-developed, well-nourished, gentleman in distress.  Pupils equal and reactive.   Extraocular Movements are full.  There is no scleral icterus.  The mouth and pharynx are normal.  The neck is supple.  The carotids reveal no bruits.  The jugular venous pressure is normal.  The thyroid is not enlarged.  There is no lymphadenopathy.  The chest is clear to percussion and auscultation. There are no rales or rhonchi. Expansion of the chest is symmetrical.  The precordium is quiet.  The first heart sound is normal.  The second heart sound is physiologically split.  There is no  gallop rub or click.  There is no abnormal lift or heave.  There is a grade 2/6 mid and late systolic murmur at the apex, consistent with mitral valve prolapse. The abdomen is soft and nontender.  Bowel sounds are normal. The liver and spleen are not enlarged. There Are no abdominal masses. There are no bruits.  The pedal pulses are good.  There is no phlebitis or edema.  There is no cyanosis or clubbing. Strength is normal and symmetrical in all extremities.  There is no lateralizing weakness.  There are no sensory deficits.  The skin is warm and dry.  There is no rash.  EKG shows sinus bradycardia, and possible left atrial enlargement    EKG today shows normal sinus rhythm with rightward axis and biatrial enlargement and is unchanged since 09/06/11   Assessment / Plan:  Continue on same medication.  He will return soon for an echocardiogram.  Return in 12 months for followup office visit.  And EKG to

## 2013-12-12 ENCOUNTER — Ambulatory Visit (HOSPITAL_COMMUNITY): Payer: BC Managed Care – PPO | Attending: Cardiology | Admitting: Cardiology

## 2013-12-12 DIAGNOSIS — Z8774 Personal history of (corrected) congenital malformations of heart and circulatory system: Secondary | ICD-10-CM | POA: Insufficient documentation

## 2013-12-12 DIAGNOSIS — I059 Rheumatic mitral valve disease, unspecified: Secondary | ICD-10-CM

## 2013-12-12 DIAGNOSIS — E785 Hyperlipidemia, unspecified: Secondary | ICD-10-CM | POA: Insufficient documentation

## 2013-12-12 DIAGNOSIS — I341 Nonrheumatic mitral (valve) prolapse: Secondary | ICD-10-CM

## 2013-12-12 NOTE — Progress Notes (Signed)
Echo performed. 

## 2013-12-14 ENCOUNTER — Telehealth: Payer: Self-pay | Admitting: Cardiology

## 2013-12-14 NOTE — Telephone Encounter (Signed)
New message ° ° ° ° ° °Want echo results °

## 2013-12-14 NOTE — Telephone Encounter (Signed)
Notified of echo results.

## 2014-02-13 ENCOUNTER — Other Ambulatory Visit: Payer: Self-pay | Admitting: Cardiology

## 2014-10-29 ENCOUNTER — Ambulatory Visit (INDEPENDENT_AMBULATORY_CARE_PROVIDER_SITE_OTHER): Payer: BC Managed Care – PPO | Admitting: Cardiology

## 2014-10-29 VITALS — BP 122/72 | HR 63 | Ht 74.0 in | Wt 178.0 lb

## 2014-10-29 DIAGNOSIS — I341 Nonrheumatic mitral (valve) prolapse: Secondary | ICD-10-CM

## 2014-10-29 NOTE — Progress Notes (Addendum)
Brandon DrapeMatthew S Rangel Date of Birth:  1971-06-24 [address] [facility] [address] [place], [place]  [phone]        Fax   [phone]   History of Present Illness: Pleasant 43 year old gentleman is seen for a scheduled followup office visit.Marland Kitchen. Has a history of mitral valve prolapse. A previous echocardiogram on 09/09/11 showed ejection fraction 55-60% with moderate mitral regurgitation and mild left atrial enlargement. He has had some mild dyspnea walking up hills, but otherwise no cardiac symptoms. 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  His last echocardiogram on 12/12/13 showed an ejection fraction of 50-55% with moderate bileaflet prolapse and mild mitral regurgitation.  He has a remote history of left ventricular systolic dysfunction which has improved on carvedilol. Since we last saw him he has started to do some jogging and has run in some 5K races.  He now has a girlfriend who is a runner.  Current Outpatient Prescriptions  Medication Sig Dispense Refill  . aspirin 81 MG tablet Take 81 mg by mouth daily.      . carvedilol (COREG) 25 MG tablet Take 1 tablet (25 mg total) by mouth 2 (two) times daily with a meal. 180 tablet 3  . ezetimibe-simvastatin (VYTORIN) 10-20 MG per tablet Take 1 tablet by mouth 3 (three) times a week.      . mycophenolate (CELLCEPT) 500 MG tablet Take 750 mg by mouth 2 (two) times daily.      . tacrolimus (PROGRAF) 0.5 MG capsule Take 0.5 mg by mouth 2 (two) times daily. Take 1 mg in the morning and .5 mg at night.     No current facility-administered medications for this visit.    No Known Allergies  Patient Active Problem List   Diagnosis Date Noted  . MVP (mitral valve prolapse) 09/06/2011  . Hypercholesterolemia 09/06/2011    History  Smoking status  . Never Smoker     Smokeless tobacco  . Not on file    History  Alcohol Use No    Family History  Problem Relation Age of Onset  . Ovarian cancer Mother     Review of Systems: Constitutional: no fever chills diaphoresis or fatigue or change in weight.  Head and neck: no hearing loss, no epistaxis, no photophobia or visual disturbance. Respiratory: No cough, shortness of breath or wheezing. Cardiovascular: No chest pain peripheral edema, palpitations. Gastrointestinal: No abdominal distention, no abdominal pain, no change in bowel habits hematochezia or melena. Genitourinary: No dysuria, no frequency, no urgency, no nocturia. Musculoskeletal:No arthralgias, no back pain, no gait disturbance or myalgias. Neurological: No dizziness, no headaches, no numbness, no seizures, no syncope, no weakness, no tremors. Hematologic: No lymphadenopathy, no easy bruising. Psychiatric: No confusion, no hallucinations, no sleep disturbance.   Wt Readings from Last 3 Encounters:  10/29/14 178 lb (80.74 kg)  12/11/13 178 lb 12.8 oz (81.103 kg)  09/06/11 183 lb (83.008 kg)    Physical Exam: Filed Vitals:   10/29/14 1610  BP: 122/72  Pulse: 63  The patient appears to be in no distress.  Head and neck exam reveals that the pupils are equal and reactive.  The extraocular movements are full.  There is no scleral icterus.  Mouth and pharynx are benign.  No lymphadenopathy.  No carotid bruits.  The jugular venous pressure is normal.  Thyroid is not enlarged or tender.  Chest is clear to percussion and auscultation.  No rales or rhonchi.  Expansion of the chest is symmetrical.  Heart reveals no abnormal lift or heave.  First and second heart sounds are normal.  There is no  gallop rub or click.  There is a grade 2/6 mid and late systolic murmur at apex.  The abdomen is soft and nontender.  Bowel sounds are normoactive.  There is no hepatosplenomegaly or mass.  There are no abdominal bruits.  Extremities reveal no  phlebitis or edema.  Pedal pulses are good.  There is no cyanosis or clubbing.  Neurologic exam is normal strength and no lateralizing weakness.  No sensory deficits.  Integument reveals no rash  EKG shows normal sinus rhythm with biatrial enlargement and mild right axis deviation and is unchanged since 12/11/13.  Assessment: 1.  Mitral valve prolapse 2.  Past history of left ventricular systolic dysfunction, improved on carvedilol. 3.  Hypercholesterolemia.  Labs are followed nephrology. 4.  Mild erectile dysfunction.  Carvedilol may be contributing.  He is due for another echocardiogram in February.  We can consider trying to cut back on his carvedilol at some point.  We will await the results of the next echo first to be sure that his ejection fraction is maintaining normal.  From a cardiology standpoint he would be a candidate for Viagra or Cialis.  He is not on nitrates.  Recheck for follow-up office visit in 6 months

## 2014-10-29 NOTE — Patient Instructions (Signed)
Your physician recommends that you continue on your current medications as directed. Please refer to the Current Medication list given to you today.  Your physician wants you to follow-up in: 6 month ov You will receive a reminder letter in the mail two months in advance. If you don't receive a letter, please call our office to schedule the follow-up appointment.   Your physician has requested that you have an echocardiogram. Echocardiography is a painless test that uses sound waves to create images of your heart. It provides your doctor with information about the size and shape of your heart and how well your heart's chambers and valves are working. This procedure takes approximately one hour. There are no restrictions for this procedure.  AFTER 12/12/14

## 2014-12-17 ENCOUNTER — Ambulatory Visit (HOSPITAL_COMMUNITY): Payer: BLUE CROSS/BLUE SHIELD | Attending: Cardiology | Admitting: Radiology

## 2014-12-17 DIAGNOSIS — I341 Nonrheumatic mitral (valve) prolapse: Secondary | ICD-10-CM | POA: Insufficient documentation

## 2014-12-17 NOTE — Progress Notes (Signed)
Echocardiogram performed.  

## 2014-12-18 ENCOUNTER — Telehealth: Payer: Self-pay | Admitting: *Deleted

## 2014-12-18 DIAGNOSIS — I340 Nonrheumatic mitral (valve) insufficiency: Secondary | ICD-10-CM

## 2014-12-18 NOTE — Telephone Encounter (Signed)
Advised patient and order for Xray in Epic

## 2014-12-18 NOTE — Telephone Encounter (Signed)
-----   Message from Cassell Clementhomas Brackbill, MD sent at 12/17/2014  9:20 PM EST ----- Please report.  The echo shows at least moderate Mitral regurgitation..  The LV function is still normal with EF 55-60%. I would like him to get a chest xray to look at his heart size before making any medicine changes.

## 2014-12-19 ENCOUNTER — Ambulatory Visit
Admission: RE | Admit: 2014-12-19 | Discharge: 2014-12-19 | Disposition: A | Discharge status: Home / Self Care | Payer: BLUE CROSS/BLUE SHIELD | Source: Ambulatory Visit | Attending: Cardiology | Admitting: Cardiology

## 2015-02-18 ENCOUNTER — Ambulatory Visit (INDEPENDENT_AMBULATORY_CARE_PROVIDER_SITE_OTHER): Payer: BLUE CROSS/BLUE SHIELD | Admitting: Cardiology

## 2015-02-18 ENCOUNTER — Encounter: Payer: Self-pay | Admitting: Cardiology

## 2015-02-18 VITALS — BP 130/76 | HR 57 | Ht 74.0 in | Wt 170.1 lb

## 2015-02-18 DIAGNOSIS — I340 Nonrheumatic mitral (valve) insufficiency: Secondary | ICD-10-CM

## 2015-02-18 DIAGNOSIS — I341 Nonrheumatic mitral (valve) prolapse: Secondary | ICD-10-CM | POA: Diagnosis not present

## 2015-02-18 DIAGNOSIS — E7800 Pure hypercholesterolemia, unspecified: Secondary | ICD-10-CM

## 2015-02-18 NOTE — Progress Notes (Signed)
Cardiology Office Note   Date:  02/18/2015   ID:  Brandon Rangel, DOB Jan 21, 1971, MRN [id number]  PCP:  No primary care provider on file.  Cardiologist:   Cassell Clementhomas Areatha Kalata, MD   No chief complaint on file.     History of Present Illness: Brandon Rangel is a 44 y.o. male who presents for routine follow-up office visit  Pleasant 44 year old gentleman is seen for a scheduled followup office visit.Marland Kitchen. Has a history of mitral valve prolapse.He has a history of a remote dilated cardiomyopathy and low ejection fraction.  A previous echocardiogram on 09/09/11 showed ejection fraction 55-60% with moderate mitral regurgitation and mild left atrial enlargement. He has had some mild dyspnea walking up hills, but otherwise no cardiac symptoms. 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 His last echocardiogram on 12/12/13 showed an ejection fraction of 50-55% with moderate bileaflet prolapse and mild mitral regurgitation. He has a remote history of left ventricular systolic dysfunction which has improved on carvedilol.  His most recent echocardiogram in February 2016 showed moderate mitral regurgitation and slight improvement in ejection fraction at 55-60%. The patient is not having any significant exertional dyspnea or other cardiac symptoms.  He has taken up running because his girlfriend is a runner.  Earlier this month they [place] ran in [place] [employer] [place].  He did not have any unusual dyspnea was able to complete the run. The patient has been experiencing some problems with erectile dysfunction.  He is concerned that perhaps the carvedilol is causing this. Since we last saw him he has started to do some jogging and has run in some 5K races. He now has a girlfriend who is a runner.  Past Medical  History  Diagnosis Date  . MVP (mitral valve prolapse)   . History of Goodpasture's syndrome   . Renal failure   . Hypertension   . Pectus excavatum     Past Surgical History  Procedure Laterality Date  . Kidney transplant    . Pectus excavatum repair    . [place] echocardiography  01/08/2010    EF 55-60%  . [place] echocardiography  01/06/2009    EF 55-60%  . [place] echocardiography  01/26/2007    EF 55-60%  . [place] echocardiography  07/28/2005    EF 50-55%  . [place] echocardiography  07/27/2004    EF 40-45%  . [place] echocardiography  05/09/2003    EF 45-50%  . Cardiovascular stress test  12/25/2002    EF 35%. EVIDENCE OF QUESTIONABLE MILD REVERSIBLE ISCHEMIA IN THE DISTRIBUTION OF THE DIGONAL VESSEL     Current Outpatient Prescriptions  Medication Sig Dispense Refill  . aspirin 81 MG tablet Take 81 mg by mouth daily.      . carvedilol (COREG) 25 MG tablet Take 1 tablet (25 mg total) by mouth 2 (two) times daily with a meal. 180 tablet 3  . ezetimibe-simvastatin (VYTORIN) 10-20 MG per tablet Take 1 tablet by mouth 3 (three) times a week.      . mycophenolate (CELLCEPT) 500 MG tablet Take 750 mg by mouth 2 (two) times daily.      . tacrolimus (PROGRAF) 0.5 MG capsule Take 0.5 mg by mouth 2 (two) times daily. Take 1 mg in the morning and .5 mg at night.  No current facility-administered medications for this visit.    Allergies:   Review of patient's allergies indicates no known allergies.    Social History:  The patient  reports that he has never smoked. He does not have any smokeless tobacco history on file. He reports that he does not drink alcohol or use illicit drugs.   Family History:  The patient's family history includes Ovarian cancer in his mother.    ROS:  Please see the history of present illness.   Otherwise, review of systems are positive for none.   All other systems are reviewed and negative.    PHYSICAL EXAM: VS:  BP 130/76 mmHg  Pulse 57  Ht  (1.88 m)  Wt 170 lb 1.9 oz  (77.166 kg)  BMI 21.83 kg/m2 , BMI Body mass index is 21.83 kg/(m^2). GEN: Well nourished, well developed, in no acute distress HEENT: normal Neck: no JVD, carotid bruits, or masses Cardiac: RRR; grade 2/6 midsystolic murmur of mitral valve prolapse at apex Respiratory:  clear to auscultation bilaterally, normal work of breathing GI: soft, nontender, nondistended, + BS MS: no deformity or atrophy Skin: warm and dry, no rash Neuro:  Strength and sensation are intact Psych: euthymic mood, full affect   EKG:  EKG is not ordered today.    Recent Labs: No results found for requested labs within last 365 days.    Lipid Panel No results found for: CHOL, TRIG, HDL, CHOLHDL, VLDL, LDLCALC, LDLDIRECT    Wt Readings from Last 3 Encounters:  02/18/15 170 lb 1.9 oz (77.166 kg)  10/29/14 178 lb (80.74 kg)  12/11/13 178 lb 12.8 oz (81.103 kg)      Other studies Reviewed: Additional studies/ records that were reviewed today include: Reviewed recent chest x-rays showing no change in mild cardiomegaly.. Review of the above records demonstrates:    ASSESSMENT AND PLAN:  1. Mitral valve prolapse  2. Past history of left ventricular systolic dysfunction, improved on carvedilol.  3. Hypercholesterolemia. Labs are followed by Dr. Alvy Beal, nephrology.  4. Mild erectile dysfunction.  Carvedilol may be contributing to this. We could consider cutting back on carvedilol and possibly adding low dose ARB for his mitral regurgitation and prior left ventricular systolic dysfunction.  I would want Dr. Edd Arbour input as to whether we could use an ARB in this man with kidney transplant.  We could also consider a trial of Viagra or Cialis.  Patient will discuss this with Dr. Briant Cedar at his next visit   Current medicines are reviewed at length with the patient today.  The patient does not have concerns regarding medicines.  The following changes have been made:  no change  Labs/ tests ordered  today include:  No orders of the defined types were placed in this encounter.    Disposition: Continue current medication for now.  Consider addition of ACE inhibitor or ARB if okay with nephrology.  Would allow [place] to cut back on his carvedilol and see if problems with ED improve.   Signed, Cassell Clement, MD  02/18/2015 5:39 PM    [facility] [address] [place], [place], [place]  [address] Phone: [phone]; Fax: [phone]

## 2015-02-18 NOTE — Patient Instructions (Signed)
Your physician recommends that you continue on your current medications as directed. Please refer to the Current Medication list given to you today.  Your physician wants you to follow-up in: 6 months with Dr. Brackbill.  You will receive a reminder letter in the mail two months in advance. If you don't receive a letter, please call our office to schedule the follow-up appointment.  

## 2015-07-15 ENCOUNTER — Other Ambulatory Visit: Payer: Self-pay | Admitting: Cardiology

## 2015-10-09 IMAGING — CR DG CHEST 2V
2 series · 2 of 2 positions shown · non-contrast
Comparison: Three 01/13/2010.

CLINICAL DATA: Mitral valve prolapse.

EXAM:
CHEST  2 VIEW

[view not recorded (1 of 2)]
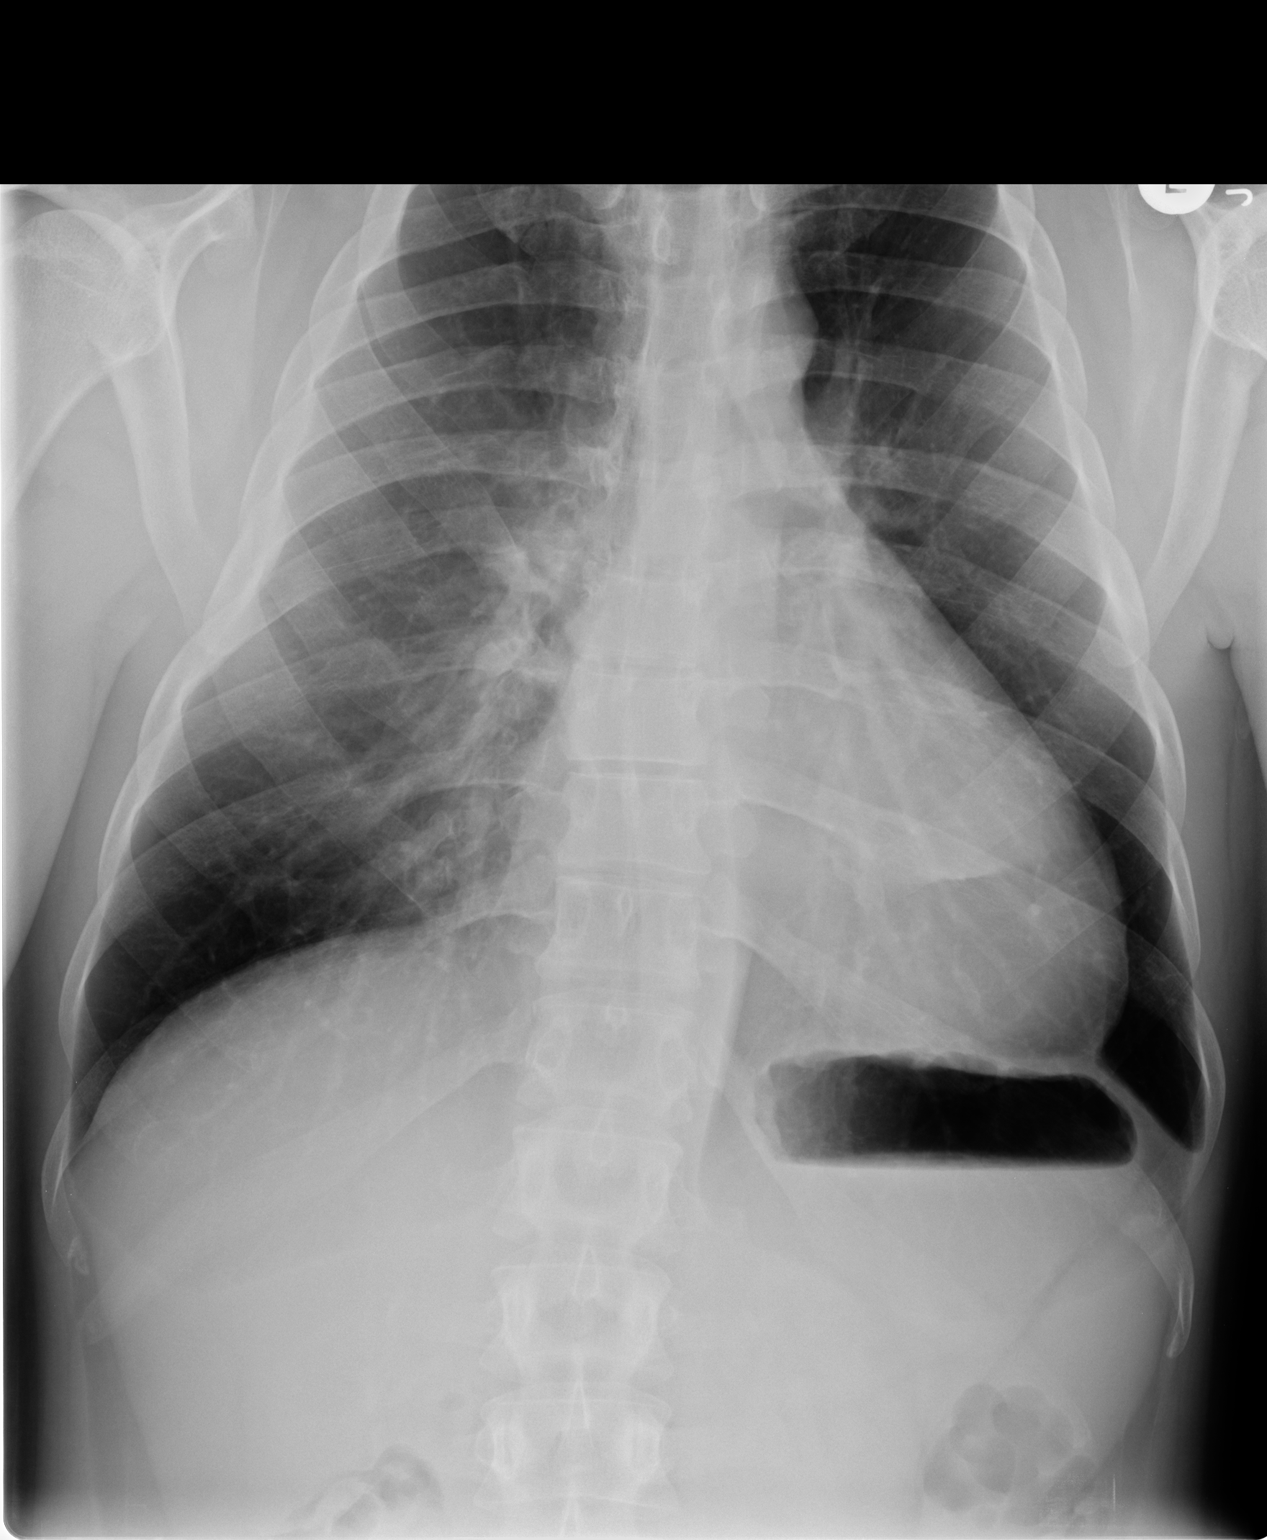

[view not recorded (2 of 2)]
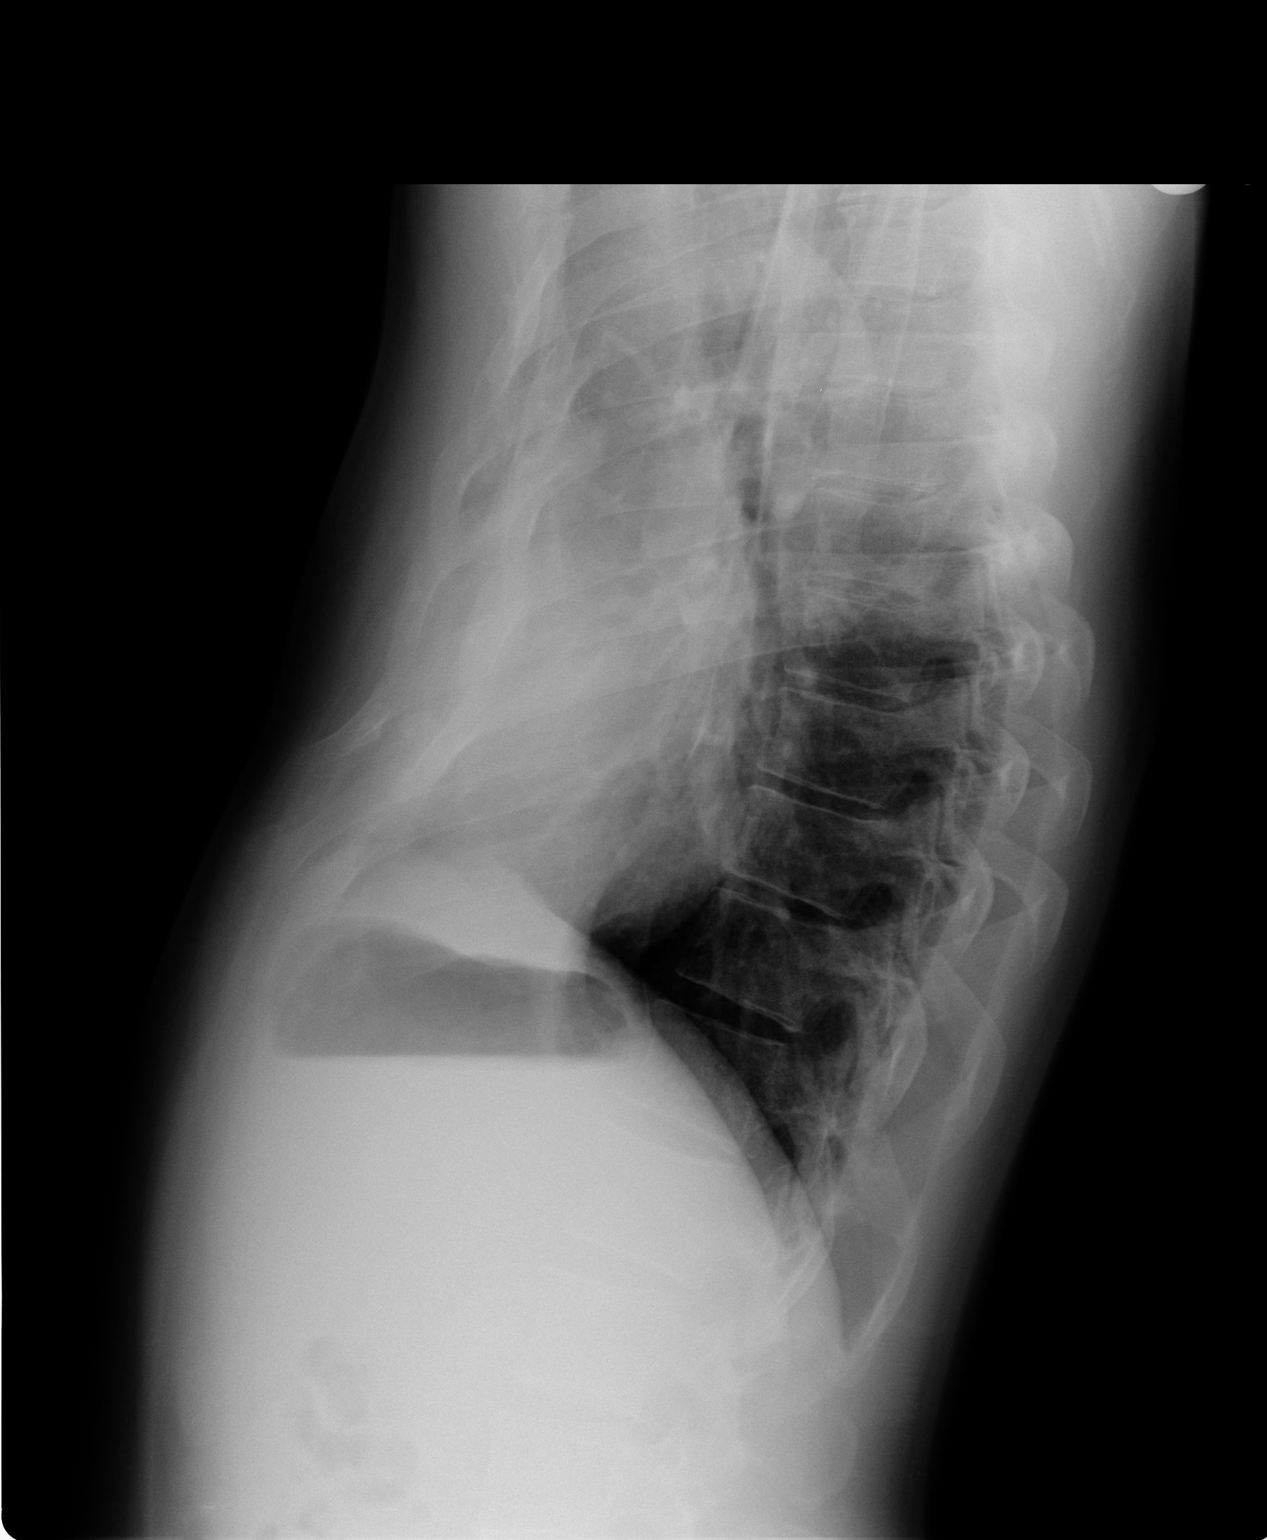

[2 of 2 positions shown; findings below may reference images not displayed]

FINDINGS: Mediastinum hilar structures normal. Cardiomegaly with normal
pulmonary vascularity. No focal infiltrate. Pectus deformity noted.
This results in slight increased density over the right chest. No
pleural effusion or pneumothorax. No acute bony abnormality.
IMPRESSION: 1. Cardiomegaly.  No pulmonary venous congestion.
2. Pectus deformity.  Chest is stable from prior exam.

## 2015-12-19 ENCOUNTER — Telehealth: Payer: Self-pay | Admitting: Cardiology

## 2015-12-19 ENCOUNTER — Other Ambulatory Visit: Payer: Self-pay | Admitting: *Deleted

## 2015-12-19 DIAGNOSIS — I340 Nonrheumatic mitral (valve) insufficiency: Secondary | ICD-10-CM

## 2015-12-19 MED ORDER — CARVEDILOL 25 MG PO TABS: ORAL_TABLET | ORAL | Status: DC

## 2015-12-19 NOTE — Telephone Encounter (Signed)
Rx sent in for one ninety day supply. 

## 2015-12-19 NOTE — Telephone Encounter (Signed)
New Message  Pt sent message to scheduling pool to sched recall w/ Brackbill and an echo- appts were made but there is no order for echo in the system. Please advise.

## 2015-12-19 NOTE — Telephone Encounter (Signed)
New Message   *STAT* If patient is at the pharmacy, call can be transferred to refill team.   1. Which medications need to be refilled? (please list name of each medication and dose if known) Carvedilol   2. Which pharmacy/location (including street and city if local pharmacy) is medication to be sent to? NEW- [facility]  3. Do they need a 30 day or 90 day supply? 180

## 2015-12-19 NOTE — Telephone Encounter (Signed)
Echo ordered placed as requested

## 2016-01-01 ENCOUNTER — Ambulatory Visit (HOSPITAL_COMMUNITY): Payer: BLUE CROSS/BLUE SHIELD | Attending: Cardiovascular Disease

## 2016-01-01 ENCOUNTER — Ambulatory Visit (INDEPENDENT_AMBULATORY_CARE_PROVIDER_SITE_OTHER): Payer: BLUE CROSS/BLUE SHIELD | Admitting: Cardiology

## 2016-01-01 ENCOUNTER — Other Ambulatory Visit: Payer: Self-pay

## 2016-01-01 ENCOUNTER — Encounter: Payer: Self-pay | Admitting: Cardiology

## 2016-01-01 VITALS — BP 110/84 | HR 50 | Ht 74.0 in | Wt 173.6 lb

## 2016-01-01 DIAGNOSIS — I517 Cardiomegaly: Secondary | ICD-10-CM | POA: Diagnosis not present

## 2016-01-01 DIAGNOSIS — I340 Nonrheumatic mitral (valve) insufficiency: Secondary | ICD-10-CM | POA: Insufficient documentation

## 2016-01-01 DIAGNOSIS — I341 Nonrheumatic mitral (valve) prolapse: Secondary | ICD-10-CM | POA: Diagnosis not present

## 2016-01-01 DIAGNOSIS — I059 Rheumatic mitral valve disease, unspecified: Secondary | ICD-10-CM | POA: Diagnosis present

## 2016-01-01 NOTE — Patient Instructions (Signed)
Medication Instructions:  Your physician recommends that you continue on your current medications as directed. Please refer to the Current Medication list given to you today.  Labwork: NONE  Testing/Procedures: NONE  Follow-Up: Your physician wants you to follow-up in: 6 MONTH OV WITH DR CRENSHAW AT [facility]  You will receive a reminder letter in the mail two months in advance. If you don't receive a letter, please call our office to schedule the follow-up appointment.  If you need a refill on your cardiac medications before your next appointment, please call your pharmacy.  

## 2016-01-01 NOTE — Progress Notes (Signed)
Cardiology Office Note   Date:  01/01/2016   ID:  Brandon Rangel, DOB Mar 29, 1971, MRN [id number]  PCP:  Dyke Maes, MD  Cardiologist: Cassell Clement MD  Chief Complaint  Patient presents with  . scheduled follow up      History of Present Illness: Brandon Rangel is a 45 y.o. male who presents for scheduled 6 month visit.  He also had his echocardiogram today  Has a history of mitral valve prolapse.He has a history of a remote dilated cardiomyopathy and low ejection fraction. A previous echocardiogram on 12/17/14 showed ejection fraction 55-60% with moderate mitral regurgitation and mild left atrial enlargement. He has not been experiencing any chest pain or shortness of breath.  He has been running for exercise.  He plans to do a half marathon next month in [place]. 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   Past Medical History  Diagnosis Date  . MVP (mitral valve prolapse)   . History of Goodpasture's syndrome   . Renal failure   . Hypertension   . Pectus excavatum     Past Surgical History  Procedure Laterality Date  . Kidney transplant    . Pectus excavatum repair    . US echocardiography  01/08/2010    EF 55-60%  . US echocardiography  01/06/2009    EF 55-60%  . US echocardiography  01/26/2007    EF 55-60%  . US echocardiography  07/28/2005    EF 50-55%  . US echocardiography  07/27/2004    EF 40-45%  . US echocardiography  05/09/2003    EF 45-50%  . Cardiovascular stress test  12/25/2002    EF 35%. EVIDENCE OF QUESTIONABLE MILD REVERSIBLE ISCHEMIA IN THE DISTRIBUTION OF THE DIGONAL VESSEL     Current Outpatient Prescriptions  Medication Sig Dispense Refill  . aspirin 81 MG tablet Take 81 mg by mouth daily.      . carvedilol (COREG) 25 MG tablet TAKE 1 TABLET BY MOUTH TWICE A DAY WITH A MEAL  180 tablet 0  . ezetimibe-simvastatin (VYTORIN) 10-20 MG per tablet Take 1 tablet by mouth 3 (three) times a week.      . mycophenolate (CELLCEPT) 500 MG tablet Take 750 mg by mouth 2 (two) times daily.      . tacrolimus (PROGRAF) 0.5 MG capsule Take 0.5 mg by mouth 2 (two) times daily. Take 1 mg in the morning and .5 mg at night.    . tadalafil (CIALIS) 20 MG tablet Take 20 mg by mouth daily as needed. ED     No current facility-administered medications for this visit.    Allergies:   Lipitor    Social History:  The patient  reports that he has never smoked. He does not have any smokeless tobacco history on file. He reports that he does not drink alcohol or use illicit drugs.   Family History:  The patient's family history includes Ovarian cancer in his mother.    ROS:  Please see the history of present illness.   Otherwise, review of systems are positive for none.   All other systems are reviewed and negative.    PHYSICAL EXAM: VS:  BP 110/84 mmHg  Pulse 50  Ht  (1.88 m)  Wt 173 lb 9.6 oz (78.744 kg)  BMI 22.28 kg/m2 , BMI Body mass index  is 22.28 kg/(m^2). GEN: Well nourished, well developed, in no acute distress HEENT: normal Neck: no JVD, carotid bruits, or masses Cardiac: Regular sinus rhythm.  There is a grade 2/6 mid and late systolic murmur of mitral regurgitation at apex.  No diastolic murmur.No, rubs, or gallops,no edema  Respiratory:  clear to auscultation bilaterally, normal work of breathing GI: soft, nontender, nondistended, + BS MS: no deformity or atrophy Skin: warm and dry, no rash Neuro:  Strength and sensation are intact Psych: euthymic mood, full affect   EKG:  EKG is ordered today. The ekg ordered today demonstrates sinus bradycardia at 51 bpm.  Benign repolarization.   Recent Labs: No results found for requested labs within last 365 days.    Lipid Panel No results found for: CHOL, TRIG, HDL, CHOLHDL, VLDL, LDLCALC, LDLDIRECT    Wt Readings  from Last 3 Encounters:  01/01/16 173 lb 9.6 oz (78.744 kg)  02/18/15 170 lb 1.9 oz (77.166 kg)  10/29/14 178 lb (80.74 kg)        ASSESSMENT AND PLAN:  1.  Mitral valve prolapse.  Echocardiogram today shows normal left ventricular systolic function and moderate mitral regurgitation and mild left atrial enlargement, essentially unchanged from last year.  The patient is asymptomatic.  He is running road races including 5Ks and plans to run a half marathon next month. 2.  Status post Goodpasture's disease with renal failure and subsequent successful kidney transplant.  Followed by Dr. Briant Cedar 3.  Hypercholesterolemia followed by Dr. Briant Cedar.  Patient is on Vytorin 4.  Erectile dysfunction, uses Cialis  Current medicines are reviewed at length with the patient today.  The patient does not have concerns regarding medicines.  The following changes have been made:  no change  Labs/ tests ordered today include:   Orders Placed This Encounter  Procedures  . EKG 12-Lead     Disposition:   Continue current medication.  Recheck in 6 months for follow-up office visit with Dr. Jens Som.  Karie Schwalbe MD 01/01/2016 5:33 PM    [facility] [address] [place], [place], [place]  [address] Phone: [phone]; Fax: [phone]

## 2016-01-02 ENCOUNTER — Telehealth: Payer: Self-pay | Admitting: Internal Medicine

## 2016-01-02 NOTE — Telephone Encounter (Signed)
F/u  Pt returning RN phone call concerning echo results. Please call back and discus.s  

## 2016-01-02 NOTE — Telephone Encounter (Signed)
-----   Message from Cassell Clement, MD sent at 01/01/2016  6:06 PM EST ----- Reported at office visit today.  Echo is essentially unchanged from a year ago.  Continue current medication.

## 2016-01-02 NOTE — Telephone Encounter (Signed)
Left message to call back  

## 2016-01-02 NOTE — Telephone Encounter (Signed)
Advised patient of echo results.

## 2016-02-11 ENCOUNTER — Other Ambulatory Visit: Payer: Self-pay | Admitting: Cardiology

## 2016-02-26 ENCOUNTER — Telehealth: Payer: Self-pay

## 2016-02-26 NOTE — Telephone Encounter (Signed)
Spoke with Ron at [facility] made him aware Dr.Brackbill has retired from [facility].  He is also aware I left a VM with Dr. Briant CedarMattingly  Assist Ebony  at [facility]  To see if he would be willing to sign d/c. Per Ron he will call me back if he hears anything about someone signing the d/c.  I will also call and update once I hear back from [facility].

## 2016-02-26 NOTE — Telephone Encounter (Signed)
Left VM with Ebony Dr.Mattingly assistant to call me back.

## 2016-02-26 NOTE — Telephone Encounter (Signed)
Spoke with Ron [facility] ok to discard of d/c I have a new one will be taken to [facility] for signing.

## 2016-02-26 NOTE — Telephone Encounter (Signed)
Original D/C received from [facility] Dr.Brackbill is no longer here to sign. Spoke with Juliette AlcideMelinda she suggested I speak with Dr.Michael Mattingly,MD  And see if he will be ok with signing.

## 2016-03-08 DEATH — deceased
